# Patient Record
Sex: Male | Born: 2000 | Race: Black or African American | Hispanic: No | Marital: Single | Smoking: Never smoker
Health system: Southern US, Community
[De-identification: ages and names within clinical notes are randomized; demographics above are authoritative.]

---
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
## 2020-02-19 IMAGING — DX PORTABLE CHEST - 1 VIEW
1 series · 1 of 1 positions shown · non-contrast
Comparison: None.

CLINICAL DATA: Nausea and vomiting

EXAM:
PORTABLE CHEST 1 VIEW

[chest ap]
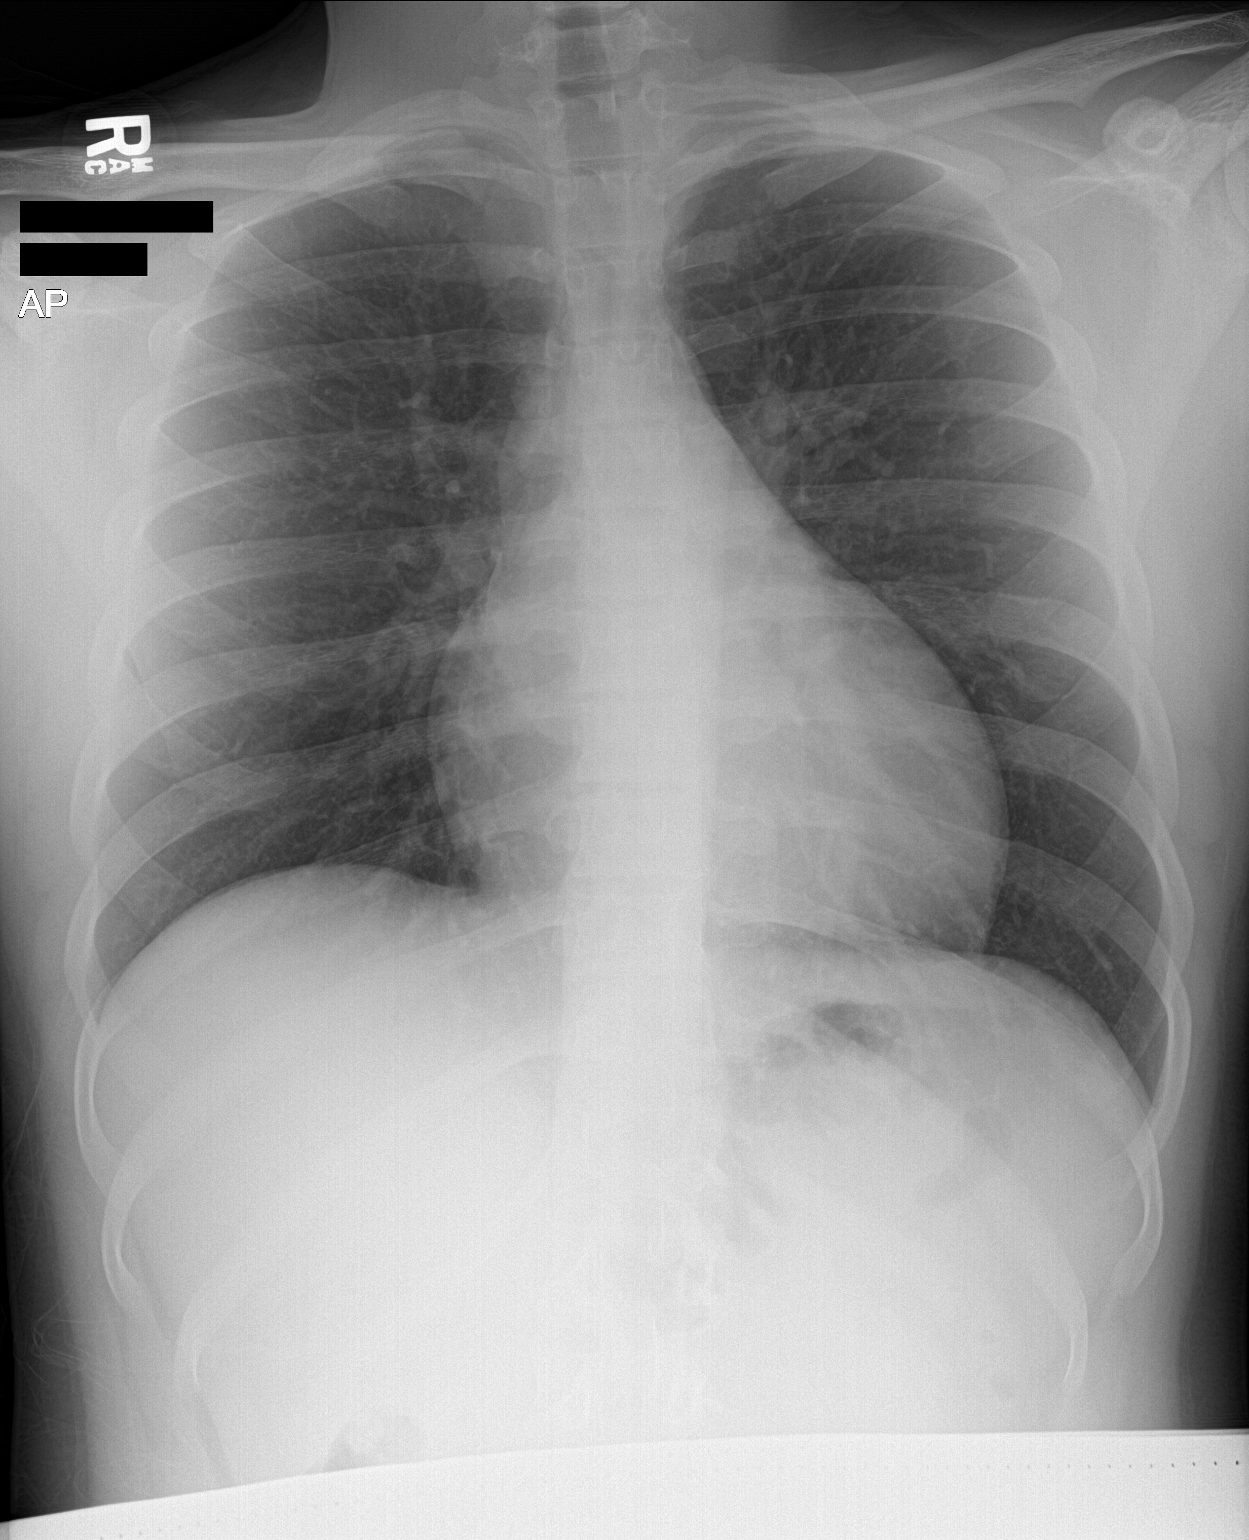

[1 of 1 positions shown; findings below may reference images not displayed]

FINDINGS: The heart size and mediastinal contours are within normal limits.
Both lungs are clear. The visualized skeletal structures are
unremarkable.
IMPRESSION: No active disease.

## 2020-06-05 ENCOUNTER — Encounter (HOSPITAL_BASED_OUTPATIENT_CLINIC_OR_DEPARTMENT_OTHER): Payer: Self-pay | Admitting: Emergency Medicine

## 2020-06-05 ENCOUNTER — Emergency Department (HOSPITAL_BASED_OUTPATIENT_CLINIC_OR_DEPARTMENT_OTHER)
Admission: EM | Admit: 2020-06-05 | Discharge: 2020-06-06 | Disposition: A | Discharge status: Home / Self Care | Payer: 59 | Attending: Emergency Medicine | Admitting: Emergency Medicine

## 2020-06-05 ENCOUNTER — Other Ambulatory Visit: Payer: Self-pay

## 2020-06-05 DIAGNOSIS — R110 Nausea: Secondary | ICD-10-CM

## 2020-06-05 DIAGNOSIS — R070 Pain in throat: Secondary | ICD-10-CM | POA: Insufficient documentation

## 2020-06-05 DIAGNOSIS — Z20822 Contact with and (suspected) exposure to covid-19: Secondary | ICD-10-CM | POA: Diagnosis not present

## 2020-06-05 DIAGNOSIS — R112 Nausea with vomiting, unspecified: Secondary | ICD-10-CM | POA: Insufficient documentation

## 2020-06-05 LAB — CBC WITH DIFFERENTIAL/PLATELET
Abs Immature Granulocytes: 0.05 10*3/uL (ref 0.00–0.07)
Basophils Absolute: 0 10*3/uL (ref 0.0–0.1)
Basophils Relative: 0 %
Eosinophils Absolute: 0 10*3/uL (ref 0.0–0.5)
Eosinophils Relative: 0 %
HCT: 45.4 % (ref 39.0–52.0)
Hemoglobin: 16.1 g/dL (ref 13.0–17.0)
Immature Granulocytes: 1 %
Lymphocytes Relative: 9 %
Lymphs Abs: 0.9 10*3/uL (ref 0.7–4.0)
MCH: 30.1 pg (ref 26.0–34.0)
MCHC: 35.5 g/dL (ref 30.0–36.0)
MCV: 85 fL (ref 80.0–100.0)
Monocytes Absolute: 0.5 10*3/uL (ref 0.1–1.0)
Monocytes Relative: 5 %
Neutro Abs: 9.3 10*3/uL — ABNORMAL HIGH (ref 1.7–7.7)
Neutrophils Relative %: 85 %
Platelets: 111 10*3/uL — ABNORMAL LOW (ref 150–400)
RBC: 5.34 MIL/uL (ref 4.22–5.81)
RDW: 12.4 % (ref 11.5–15.5)
Smear Review: ADEQUATE
WBC: 10.8 10*3/uL — ABNORMAL HIGH (ref 4.0–10.5)
nRBC: 0 % (ref 0.0–0.2)

## 2020-06-05 LAB — COMPREHENSIVE METABOLIC PANEL
ALT: 18 U/L (ref 0–44)
AST: 21 U/L (ref 15–41)
Albumin: 5.1 g/dL — ABNORMAL HIGH (ref 3.5–5.0)
Alkaline Phosphatase: 54 U/L (ref 38–126)
Anion gap: 11 (ref 5–15)
BUN: 14 mg/dL (ref 6–20)
CO2: 27 mmol/L (ref 22–32)
Calcium: 9.7 mg/dL (ref 8.9–10.3)
Chloride: 102 mmol/L (ref 98–111)
Creatinine, Ser: 0.97 mg/dL (ref 0.61–1.24)
GFR, Estimated: >60 mL/min (ref >60)
Glucose, Bld: 78 mg/dL (ref 70–99)
Potassium: 3.5 mmol/L (ref 3.5–5.1)
Sodium: 140 mmol/L (ref 135–145)
Total Bilirubin: 2.4 mg/dL — ABNORMAL HIGH (ref 0.3–1.2)
Total Protein: 8.4 g/dL — ABNORMAL HIGH (ref 6.5–8.1)

## 2020-06-05 LAB — LIPASE, BLOOD: Lipase: 25 U/L (ref 11–51)

## 2020-06-05 MED ORDER — ONDANSETRON 4 MG PO TBDP: 4.0000 mg | ORAL_TABLET | Freq: Three times a day (TID) | ORAL | 0 refills | Status: AC | PRN

## 2020-06-05 MED ORDER — ONDANSETRON 4 MG PO TBDP
4.0000 mg | ORAL_TABLET | ORAL | Status: DC | PRN
  Administered 2020-06-05: 4 mg via ORAL
  Filled 2020-06-05: qty 1

## 2020-06-05 NOTE — ED Triage Notes (Signed)
Reports n/v with abdominal cramping since this morning.

## 2020-06-05 NOTE — Discharge Instructions (Addendum)
Please do plenty of water, use Zofran as needed for nausea. I recommend sticking of relatively bland foods including bananas, applesauce, rice, toast, avocados. Drink plenty of water and fluids such as Pedialyte, Gatorade or other such fluids. Try to avoid sugar energy drinks as much as possible.

## 2020-06-05 NOTE — ED Provider Notes (Addendum)
[facility] EMERGENCY DEPARTMENT Provider Note   CSN: [id number] Arrival date & time: 06/05/20  2158     History Chief Complaint  Patient presents with   Nausea    Eduardo Brown is a 19 y.o. male.  HPI Patient is a 19 year old male with no pertinent past medical history denies any recreational drug use or any alcohol use.  He presents today to the emergency department with complaints of nausea and vomiting.  He states he has had 7 episodes of nonbloody nonbilious vomiting since he woke up this morning feeling nauseous.  He states the nausea has waxed and waned is currently very minimal.  He also endorses associated sore throat but denies any other associated symptoms including fevers, chills, diarrhea, sedation, urinary symptoms, chest pain, shortness of breath, cough, congestion, runny nose, ear pain.  No aggravating or mitigating factors.  He has had this occur once per year over the past several years.  He was seen for similar symptoms in the last ER visit and prescribed Zofran however he states he has none of his hand to use.    History reviewed. No pertinent past medical history.  There are no problems to display for this patient.   History reviewed. No pertinent surgical history.     No family history on file.  Social History   Tobacco Use   Smoking status: Never Smoker   Smokeless tobacco: Never Used  Substance Use Topics   Alcohol use: No   Drug use: No    Home Medications Prior to Admission medications   Medication Sig Start Date End Date Taking? Authorizing Provider  ondansetron (ZOFRAN ODT) 4 MG disintegrating tablet Take 1 tablet (4 mg total) by mouth every 8 (eight) hours as needed for nausea or vomiting. 06/05/20   Gailen Shelter, PA    Allergies    Patient has no known allergies.  Review of Systems   Review of Systems  Constitutional: Negative for chills and fever.  HENT: Negative for congestion.   Respiratory: Negative for  shortness of breath.   Cardiovascular: Negative for chest pain.  Gastrointestinal: Positive for nausea and vomiting. Negative for abdominal pain and diarrhea.  Musculoskeletal: Negative for neck pain.    Physical Exam Updated Vital Signs BP 106/65 (BP Location: Left Arm)    Pulse 78    Temp 98 F (36.7 C) (Oral)    Resp 18    Ht 5\' 11"  (1.803 m)    Wt 71.1 kg    SpO2 99%    BMI 21.86 kg/m   Physical Exam Vitals and nursing note reviewed.  Constitutional:      General: He is not in acute distress.    Comments: Pleasant well-appearing 19 year old.  In no acute distress.  Sitting comfortably in bed.  Able answer questions appropriately follow commands. No increased work of breathing. Speaking in full sentences.  HENT:     Head: Normocephalic and atraumatic.     Nose: Nose normal.  Eyes:     General: No scleral icterus. Cardiovascular:     Rate and Rhythm: Normal rate and regular rhythm.     Pulses: Normal pulses.     Heart sounds: Normal heart sounds.  Pulmonary:     Effort: Pulmonary effort is normal. No respiratory distress.     Breath sounds: Normal breath sounds. No wheezing, rhonchi or rales.  Abdominal:     Palpations: Abdomen is soft.     Tenderness: There is no abdominal tenderness. There is no  guarding or rebound.     Comments: No abdominal tenderness.  Abdomen is soft.  Bowel sounds are normal.  No guarding, rigidity or rebound.  No CVA tenderness.  Murphy's, McBurney's, Rovsing, psoas all negative.  Musculoskeletal:     Cervical back: Normal range of motion and neck supple. No tenderness.     Right lower leg: No edema.     Left lower leg: No edema.  Lymphadenopathy:     Cervical: No cervical adenopathy.  Skin:    General: Skin is warm and dry.     Capillary Refill: Capillary refill takes less than 2 seconds.  Neurological:     Mental Status: He is alert. Mental status is at baseline.  Psychiatric:        Mood and Affect: Mood normal.        Behavior: Behavior  normal.     ED Results / Procedures / Treatments   Labs (all labs ordered are listed, but only abnormal results are displayed) Labs Reviewed  CBC WITH DIFFERENTIAL/PLATELET - Abnormal; Notable for the following components:      Result Value   WBC 10.8 (*)    Platelets 111 (*)    Neutro Abs 9.3 (*)    All other components within normal limits  COMPREHENSIVE METABOLIC PANEL - Abnormal; Notable for the following components:   Total Protein 8.4 (*)    Albumin 5.1 (*)    Total Bilirubin 2.4 (*)    All other components within normal limits  RESPIRATORY PANEL BY RT PCR (FLU A&B, COVID)  LIPASE, BLOOD    EKG None  Radiology No results found.  Procedures Procedures (including critical care time)  Medications Ordered in ED Medications  ondansetron (ZOFRAN-ODT) disintegrating tablet 4 mg (4 mg Oral Given 06/05/20 2242)    ED Course  I have reviewed the triage vital signs and the nursing notes.  Pertinent labs & imaging results that were available during my care of the patient were reviewed by me and considered in my medical decision making (see chart for details).    MDM Rules/Calculators/A&P                          Patient is a 19 year old male presented today with seven episodes of vomiting and persistent waxing and waning nausea throughout the day that began today. He denies any fevers, chills, diarrhea, hemoptysis, cough, congestion, myalgias. He has been able to eat some and drink some water today.   He has had episodes like this approximately once per year for the past 2 years. He states that Zofran works well for him. He states that he was out of his prescription and therefore did not take any today.  Physical exam is unremarkable. He is somewhat fatigued appearing but overall very well-appearing has vital signs within normal limits. No abdominal tenderness.  CBC very mild leukocytosis likely secondary to the marginalization from him vomiting frequently. CMP without  significant electrolyte derangement Lipase within normal limits--I doubt pancreatitis Covid swab obtained at patient's request during discharge discussion.  On reassessment patient has been successfully p.o. challenge. He has drank half a bottle of Gatorade. Denies any symptoms currently. Will discharge with Zofran and have follow-up with PCP at the [facility].  Specific pathologies that I doubt are occurring today is acute intra-abdominal infection such as diverticulitis or appendicitis. His examination is quite benign. I also doubt that he has a metabolic derangement such as DKA. We  will also do intracranial pathology causing his nausea and vomiting.  Patient was given return precautions. He is agreeable with plan.  Irvin Bastin was evaluated in Emergency Department on 06/05/2020 for the symptoms described in the history of present illness. He was evaluated in the context of the global COVID-19 pandemic, which necessitated consideration that the patient might be at risk for infection with the SARS-CoV-2 virus that causes COVID-19. Institutional protocols and algorithms that pertain to the evaluation of patients at risk for COVID-19 are in a state of rapid change based on information released by regulatory bodies including the CDC and federal and state organizations. These policies and algorithms were followed during the patient's care in the ED.   Final Clinical Impression(s) / ED Diagnoses Final diagnoses:  Nausea  Non-intractable vomiting with nausea, unspecified vomiting type    Rx / DC Orders ED Discharge Orders         Ordered    ondansetron (ZOFRAN ODT) 4 MG disintegrating tablet  Every 8 hours PRN        06/05/20 2334           Gailen Shelter, [place] 06/05/20 2338    Gailen Shelter, PA 06/05/20 2339    Nira Conn, MD 06/06/20 [phone]

## 2020-06-06 LAB — RESPIRATORY PANEL BY RT PCR (FLU A&B, COVID)
Influenza A by PCR: NEGATIVE
Influenza B by PCR: NEGATIVE
SARS Coronavirus 2 by RT PCR: NEGATIVE

## 2022-08-10 ENCOUNTER — Encounter: Payer: Self-pay | Admitting: Diagnostic Neuroimaging

## 2022-08-10 ENCOUNTER — Ambulatory Visit (INDEPENDENT_AMBULATORY_CARE_PROVIDER_SITE_OTHER): Payer: BC Managed Care – PPO | Admitting: Diagnostic Neuroimaging

## 2022-08-10 VITALS — BP 105/61 | HR 55 | Ht 70.0 in | Wt 163.6 lb

## 2022-08-10 DIAGNOSIS — M25529 Pain in unspecified elbow: Secondary | ICD-10-CM

## 2022-08-10 NOTE — Patient Instructions (Addendum)
  POST-TRAUMATIC PAIN / NUMBNESS (left posterior forearm, proximal) - continue gabapentin; may increase as tolerated to 300mg  twice a day  - advised to try foam padding to protect direct contact against area with long sleeve shirt for work - consider topical lidocaine cream - consider pain mgmt consult in future for nerve block if intractable pain - return to work deferred to PCP / trauma surgery
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
